# Patient Record
Sex: Female | Born: 2000 | Race: White | Hispanic: No | Marital: Single | Smoking: Former smoker
Health system: Southern US, Community
[De-identification: ages and names within clinical notes are randomized; demographics above are authoritative.]

## PROBLEM LIST (undated history)

## (undated) ENCOUNTER — Inpatient Hospital Stay (HOSPITAL_COMMUNITY): Payer: Self-pay

## (undated) DIAGNOSIS — J45909 Unspecified asthma, uncomplicated: Secondary | ICD-10-CM

## (undated) HISTORY — PX: HERNIA REPAIR: SHX51

## (undated) HISTORY — DX: Unspecified asthma, uncomplicated: J45.909

---
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
## 2020-06-26 ENCOUNTER — Other Ambulatory Visit: Payer: Self-pay

## 2020-06-26 ENCOUNTER — Emergency Department (HOSPITAL_BASED_OUTPATIENT_CLINIC_OR_DEPARTMENT_OTHER)
Admission: EM | Admit: 2020-06-26 | Discharge: 2020-06-26 | Disposition: A | Discharge status: Home / Self Care | Payer: Medicaid Other | Attending: Emergency Medicine | Admitting: Emergency Medicine

## 2020-06-26 ENCOUNTER — Encounter (HOSPITAL_BASED_OUTPATIENT_CLINIC_OR_DEPARTMENT_OTHER): Payer: Self-pay

## 2020-06-26 DIAGNOSIS — O23591 Infection of other part of genital tract in pregnancy, first trimester: Secondary | ICD-10-CM | POA: Diagnosis not present

## 2020-06-26 DIAGNOSIS — O99281 Endocrine, nutritional and metabolic diseases complicating pregnancy, first trimester: Secondary | ICD-10-CM | POA: Diagnosis not present

## 2020-06-26 DIAGNOSIS — O3491 Maternal care for abnormality of pelvic organ, unspecified, first trimester: Secondary | ICD-10-CM | POA: Diagnosis not present

## 2020-06-26 DIAGNOSIS — O99511 Diseases of the respiratory system complicating pregnancy, first trimester: Secondary | ICD-10-CM | POA: Diagnosis not present

## 2020-06-26 DIAGNOSIS — Z87891 Personal history of nicotine dependence: Secondary | ICD-10-CM | POA: Insufficient documentation

## 2020-06-26 DIAGNOSIS — E860 Dehydration: Secondary | ICD-10-CM | POA: Insufficient documentation

## 2020-06-26 DIAGNOSIS — D72829 Elevated white blood cell count, unspecified: Secondary | ICD-10-CM

## 2020-06-26 DIAGNOSIS — O219 Vomiting of pregnancy, unspecified: Secondary | ICD-10-CM | POA: Diagnosis not present

## 2020-06-26 DIAGNOSIS — J45909 Unspecified asthma, uncomplicated: Secondary | ICD-10-CM | POA: Diagnosis not present

## 2020-06-26 DIAGNOSIS — O210 Mild hyperemesis gravidarum: Secondary | ICD-10-CM

## 2020-06-26 DIAGNOSIS — Z3A11 11 weeks gestation of pregnancy: Secondary | ICD-10-CM | POA: Diagnosis not present

## 2020-06-26 DIAGNOSIS — B379 Candidiasis, unspecified: Secondary | ICD-10-CM

## 2020-06-26 LAB — WET PREP, GENITAL
Clue Cells Wet Prep HPF POC: NONE SEEN
Sperm: NONE SEEN
Trich, Wet Prep: NONE SEEN

## 2020-06-26 LAB — COMPREHENSIVE METABOLIC PANEL
ALT: 19 U/L (ref 0–44)
AST: 19 U/L (ref 15–41)
Albumin: 4.6 g/dL (ref 3.5–5.0)
Alkaline Phosphatase: 51 U/L (ref 38–126)
Anion gap: 11 (ref 5–15)
BUN: 9 mg/dL (ref 6–20)
CO2: 20 mmol/L — ABNORMAL LOW (ref 22–32)
Calcium: 9.5 mg/dL (ref 8.9–10.3)
Chloride: 102 mmol/L (ref 98–111)
Creatinine, Ser: 0.57 mg/dL (ref 0.44–1.00)
GFR, Estimated: >60 mL/min (ref >60)
Glucose, Bld: 99 mg/dL (ref 70–99)
Potassium: 3.7 mmol/L (ref 3.5–5.1)
Sodium: 133 mmol/L — ABNORMAL LOW (ref 135–145)
Total Bilirubin: 1.6 mg/dL — ABNORMAL HIGH (ref 0.3–1.2)
Total Protein: 8.4 g/dL — ABNORMAL HIGH (ref 6.5–8.1)

## 2020-06-26 LAB — URINALYSIS, ROUTINE W REFLEX MICROSCOPIC
Glucose, UA: NEGATIVE mg/dL
Hgb urine dipstick: NEGATIVE
Ketones, ur: NEGATIVE mg/dL
Nitrite: NEGATIVE
Protein, ur: 30 mg/dL — AB
Specific Gravity, Urine: 1.025 (ref 1.005–1.030)
pH: 6 (ref 5.0–8.0)

## 2020-06-26 LAB — HCG, QUANTITATIVE, PREGNANCY: hCG, Beta Chain, Quant, S: 100919 m[IU]/mL — ABNORMAL HIGH (ref <5)

## 2020-06-26 LAB — CBC WITH DIFFERENTIAL/PLATELET
Abs Immature Granulocytes: 0.06 10*3/uL (ref 0.00–0.07)
Basophils Absolute: 0 10*3/uL (ref 0.0–0.1)
Basophils Relative: 0 %
Eosinophils Absolute: 0 10*3/uL (ref 0.0–0.5)
Eosinophils Relative: 0 %
HCT: 40.9 % (ref 36.0–46.0)
Hemoglobin: 14.8 g/dL (ref 12.0–15.0)
Immature Granulocytes: 0 %
Lymphocytes Relative: 21 %
Lymphs Abs: 3.1 10*3/uL (ref 0.7–4.0)
MCH: 31 pg (ref 26.0–34.0)
MCHC: 36.2 g/dL — ABNORMAL HIGH (ref 30.0–36.0)
MCV: 85.6 fL (ref 80.0–100.0)
Monocytes Absolute: 0.7 10*3/uL (ref 0.1–1.0)
Monocytes Relative: 4 %
Neutro Abs: 11.2 10*3/uL — ABNORMAL HIGH (ref 1.7–7.7)
Neutrophils Relative %: 75 %
Platelets: 318 10*3/uL (ref 150–400)
RBC: 4.78 MIL/uL (ref 3.87–5.11)
RDW: 11.9 % (ref 11.5–15.5)
WBC: 15.1 10*3/uL — ABNORMAL HIGH (ref 4.0–10.5)
nRBC: 0 % (ref 0.0–0.2)

## 2020-06-26 LAB — URINALYSIS, MICROSCOPIC (REFLEX)

## 2020-06-26 LAB — PREGNANCY, URINE: Preg Test, Ur: POSITIVE — AB

## 2020-06-26 MED ORDER — CLOTRIMAZOLE 1 % VA CREA: 1.0000 | TOPICAL_CREAM | Freq: Every day | VAGINAL | 0 refills | Status: AC

## 2020-06-26 MED ORDER — ONDANSETRON HCL 4 MG/2ML IJ SOLN
4.0000 mg | Freq: Once | INTRAMUSCULAR | Status: AC
  Administered 2020-06-26: 4 mg via INTRAVENOUS

## 2020-06-26 MED ORDER — FAMOTIDINE 20 MG PO TABS: 20.0000 mg | ORAL_TABLET | Freq: Two times a day (BID) | ORAL | 0 refills | Status: AC

## 2020-06-26 MED ORDER — SODIUM CHLORIDE 0.9 % IV BOLUS
1000.0000 mL | Freq: Once | INTRAVENOUS | Status: AC
  Administered 2020-06-26: 1000 mL via INTRAVENOUS

## 2020-06-26 MED ORDER — ONDANSETRON 8 MG PO TBDP: 8.0000 mg | ORAL_TABLET | Freq: Three times a day (TID) | ORAL | 0 refills | Status: AC | PRN

## 2020-06-26 MED ORDER — ONDANSETRON HCL 4 MG/2ML IJ SOLN
INTRAMUSCULAR | Status: AC
  Filled 2020-06-26: qty 2

## 2020-06-26 NOTE — Discharge Instructions (Signed)
Please pick up medication and take as prescribed to cover for a yeast infection. I have also prescribed pepcid which helps with nausea/acid reflux (take as prescribed) and take the zofran as needed for  break through vomiting.   Follow up with [facility] for your OBGYN care. They should monitor your white blood cell count as it was slightly elevated here today however this is likely due to being pregnant  Return to the ED IMMEDIATELY for any worsening symptoms including persistent vomiting despite the medications, fevers > 100.4, abdominal pain, vaginal bleeding, or any other new/concerning symptoms

## 2020-06-26 NOTE — ED Notes (Signed)
tolerating po's

## 2020-06-26 NOTE — ED Notes (Signed)
ED Provider at bedside. 

## 2020-06-26 NOTE — ED Provider Notes (Signed)
[facility] EMERGENCY DEPARTMENT Provider Note   CSN: [id number] Arrival date & time: 06/26/20  1353     History Chief Complaint  Patient presents with  . Morning Sickness    Whitney Norris is a 19 y.o. female who presents to the ED currently [redacted] weeks pregnant (G2P1) with complaint of nausea and NBNB emesis x 4 days. Pt reports she has been unable to keep anything down at home and felt like she was getting dehydrated prompting her to come to the ED today. She reports not having much issues with morning sickness with her prior pregnancy. She is new to the area and does not currently have an OBGYN. Pt also complains of some vaginal discharge with foul odor that she noticed this AM. She denies any fevers, chills, abdominal cramping, vaginal bleeding, dysuria, urinary frequency, or any other associated symptoms.   The history is provided by the patient and medical records.       Past Medical History:  Diagnosis Date  . Asthma     There are no problems to display for this patient.   Past Surgical History:  Procedure Laterality Date  . HERNIA REPAIR       OB History    Gravida  2   Para  1   Term  1   Preterm      AB      Living  1     SAB      TAB      Ectopic      Multiple  0   Live Births  1           Family History  Problem Relation Age of Onset  . Arthritis Mother   . Arthritis Father     Social History   Tobacco Use  . Smoking status: Former Games developer  . Smokeless tobacco: Never Used  Vaping Use  . Vaping Use: Never used  Substance Use Topics  . Alcohol use: No  . Drug use: Not Currently    Types: Marijuana    Home Medications Prior to Admission medications   Medication Sig Start Date End Date Taking? Authorizing Provider  acetaminophen (TYLENOL) 500 MG tablet Take 500 mg by mouth every 6 (six) hours as needed for mild pain, moderate pain or headache.     [provider]  clotrimazole (GYNE-LOTRIMIN) 1 % vaginal  cream Place 1 Applicatorful vaginally at bedtime for 7 days. 06/26/20 07/03/20  Tanda Rockers, PA-C  famotidine (PEPCID) 20 MG tablet Take 1 tablet (20 mg total) by mouth 2 (two) times daily. 06/26/20   Hyman Hopes, Esterlene Atiyeh, PA-C  ibuprofen (ADVIL,MOTRIN) 600 MG tablet Take 1 tablet (600 mg total) by mouth every 6 (six) hours. 04/13/16   Leland Her, DO  ondansetron (ZOFRAN ODT) 8 MG disintegrating tablet Take 1 tablet (8 mg total) by mouth every 8 (eight) hours as needed for nausea or vomiting. 06/26/20   Tanda Rockers, PA-C  Prenatal Multivit-Min-Fe-FA (PRENATAL VITAMINS) 0.8 MG tablet Take 1 tablet by mouth daily. 12/16/15   Judeth Horn, NP  senna-docusate (SENOKOT-S) 8.6-50 MG tablet Take 2 tablets by mouth daily. Patient not taking: Reported on 05/20/2016 04/13/16   Leland Her, DO    Allergies    Patient has no known allergies.  Review of Systems   Review of Systems  Constitutional: Negative for chills and fever.  Gastrointestinal: Positive for nausea and vomiting. Negative for abdominal pain, constipation and diarrhea.  Genitourinary: Positive for vaginal discharge. Negative  for flank pain, frequency, hematuria, urgency and vaginal bleeding.  All other systems reviewed and are negative.   Physical Exam Updated Vital Signs BP (!) 95/44   Pulse (!) 50   Temp 98.4 F (36.9 C)   Resp 18   Ht 5\' 4"  (1.626 m)   Wt 62.6 kg   SpO2 98%   BMI 23.69 kg/m   Physical Exam Vitals and nursing note reviewed.  Constitutional:      Appearance: She is not ill-appearing or diaphoretic.  HENT:     Head: Normocephalic and atraumatic.     Mouth/Throat:     Mouth: Mucous membranes are dry.  Eyes:     Conjunctiva/sclera: Conjunctivae normal.  Cardiovascular:     Rate and Rhythm: Normal rate and regular rhythm.     Pulses: Normal pulses.  Pulmonary:     Effort: Pulmonary effort is normal.     Breath sounds: Normal breath sounds. No wheezing, rhonchi or rales.  Abdominal:      Palpations: Abdomen is soft.     Tenderness: There is no abdominal tenderness. There is no right CVA tenderness, left CVA tenderness, guarding or rebound.  Genitourinary:    Comments: Chaperone present for exam. No rashes, lesions, or tenderness to external genitalia. No erythema, injury, or tenderness to vaginal mucosa. Thick white vaginal discharge in vault. No adnexal masses, tenderness, or fullness. No CMT, cervical friability, or discharge from cervical os. Cervical os is closed. Uterus non-deviated, mobile, nonTTP, and without enlargement.  Musculoskeletal:     Cervical back: Neck supple.  Skin:    General: Skin is warm and dry.  Neurological:     Mental Status: She is alert.     ED Results / Procedures / Treatments   Labs (all labs ordered are listed, but only abnormal results are displayed) Labs Reviewed  WET PREP, GENITAL - Abnormal; Notable for the following components:      Result Value   Yeast Wet Prep HPF POC PRESENT (*)    WBC, Wet Prep HPF POC MANY (*)    All other components within normal limits  URINALYSIS, ROUTINE W REFLEX MICROSCOPIC - Abnormal; Notable for the following components:   Bilirubin Urine MODERATE (*)    Protein, ur 30 (*)    Leukocytes,Ua SMALL (*)    All other components within normal limits  PREGNANCY, URINE - Abnormal; Notable for the following components:   Preg Test, Ur POSITIVE (*)    All other components within normal limits  CBC WITH DIFFERENTIAL/PLATELET - Abnormal; Notable for the following components:   WBC 15.1 (*)    MCHC 36.2 (*)    Neutro Abs 11.2 (*)    All other components within normal limits  COMPREHENSIVE METABOLIC PANEL - Abnormal; Notable for the following components:   Sodium 133 (*)    CO2 20 (*)    Total Protein 8.4 (*)    Total Bilirubin 1.6 (*)    All other components within normal limits  URINALYSIS, MICROSCOPIC (REFLEX) - Abnormal; Notable for the following components:   Bacteria, UA MANY (*)    All other  components within normal limits  HCG, QUANTITATIVE, PREGNANCY - Abnormal; Notable for the following components:   hCG, Beta Chain, Quant, S 100,919 (*)    All other components within normal limits  GC/CHLAMYDIA PROBE AMP ([place]) NOT AT [facility]    EKG None  Radiology No results found.  Procedures Procedures (including critical care time)  Medications Ordered in ED Medications  ondansetron (ZOFRAN) 4 MG/2ML injection (has no administration in time range)  sodium chloride 0.9 % bolus 1,000 mL (0 mLs Intravenous Stopped 06/26/20 1532)  ondansetron (ZOFRAN) injection 4 mg (4 mg Intravenous Given 06/26/20 1426)    ED Course  I have reviewed the triage vital signs and the nursing notes.  Pertinent labs & imaging results that were available during my care of the patient were reviewed by me and considered in my medical decision making (see chart for details).    MDM Rules/Calculators/A&P                          19 year old female currently [redacted] weeks pregnant who presents to the ED today with complaint of nausea, vomiting, and vaginal discharge. No bleeding. No pelvic or abdominal pain. On arrival to the ED pt is afebrile, nontachycardic, and nontachypneic. Appears to be in NAD. She does have dry MM on exam. No abdominal TTP. Pt does mention she is new to the area however did have a confirmatory ultrasound a couple weeks ago with positive IUP. This is her 2nd pregnancy; she did not have too much issues with morning sickness with the first pregnancy. Pt is overall well appearing; I suspect symptoms related to morning sickness. Labs have been collected prior to being seen - will await. Will provide fluids and zofran and reevaluate. In the setting of vaginal discharge will plan for pelvic exam and U/A.   CBC with leukocytosis 15.1. Again pt afebrile and nontoxic appearing. Suspect elevated s/2 pregnancy and hemoconcentration.  CMP with sodium 133. Bicarb 20. T bili slightly elevated at 1.6.  Remainder of LFTs unremarkable.  Urine preg positive - will order quant at this time for follow up purposes.  U/A with small leuks with 11-20 Wbcs however 11-20 squamous epithelia - suspect contamination.   Pelvic exam performed - vaginal discharge consistent with yeast. Wet prep has returned positive for yeast as well. Will plan to treat for yeast infection.   On further eval pt reports nausea has improved. Will fluid challenge at this time. Will plan to discharge with close OBGYN follow up. Pt to be given info for [facility]. Will provide pepcid and 8 mg zofran ODT as discussed with attending physician Dr. Clarene DukeLittle.   Pt able to tolerate water without difficulty. Will discharge home at this time. Pt is in agreement with plan and stable for discharge.   This note was prepared using Dragon voice recognition software and may include unintentional dictation errors due to the inherent limitations of voice recognition software.  Final Clinical Impression(s) / ED Diagnoses Final diagnoses:  [redacted] weeks gestation of pregnancy  Morning sickness  Yeast infection  Leukocytosis, unspecified type    Rx / DC Orders ED Discharge Orders         Ordered    famotidine (PEPCID) 20 MG tablet  2 times daily        06/26/20 1604    ondansetron (ZOFRAN ODT) 8 MG disintegrating tablet  Every 8 hours PRN        06/26/20 1604    clotrimazole (GYNE-LOTRIMIN) 1 % vaginal cream  Daily at bedtime        06/26/20 1604           Discharge Instructions     Please pick up medication and take as prescribed to cover for a yeast infection. I have also prescribed pepcid which helps with nausea/acid reflux (take as prescribed) and  take the zofran as needed for  break through vomiting.   Follow up with [facility] for your OBGYN care. They should monitor your white blood cell count as it was slightly elevated here today however this is likely due to being pregnant  Return to the ED IMMEDIATELY  for any worsening symptoms including persistent vomiting despite the medications, fevers > 100.4, abdominal pain, vaginal bleeding, or any other new/concerning symptoms       Tanda Rockers, PA-C 06/26/20 1639    Little, Ambrose [place], MD 06/26/20 1906

## 2020-06-26 NOTE — ED Triage Notes (Signed)
Pt c/o n/v x 4 days-states she is [redacted] weeks pregnant-G2P1-NAD-steady gait

## 2020-06-27 LAB — GC/CHLAMYDIA PROBE AMP (~~LOC~~) NOT AT ARMC
Chlamydia: NEGATIVE
Comment: NEGATIVE
Comment: NORMAL
Neisseria Gonorrhea: NEGATIVE
